# Patient Record
Sex: Female | Born: 2005 | Race: Black or African American | Hispanic: No | Marital: Single | Smoking: Never smoker
Health system: Southern US, Community
[De-identification: ages and names within clinical notes are randomized; demographics above are authoritative.]

---
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
## 2024-05-26 ENCOUNTER — Encounter: Payer: Self-pay | Admitting: Family Medicine
